# Patient Record
Sex: Female | Born: 1960 | Race: White | Hispanic: No | Marital: Married | Smoking: Never smoker
Health system: Southern US, Community
[De-identification: ages and names within clinical notes are randomized; demographics above are authoritative.]

## PROBLEM LIST (undated history)

## (undated) DIAGNOSIS — Z8619 Personal history of other infectious and parasitic diseases: Secondary | ICD-10-CM

## (undated) DIAGNOSIS — I080 Rheumatic disorders of both mitral and aortic valves: Secondary | ICD-10-CM

## (undated) DIAGNOSIS — K219 Gastro-esophageal reflux disease without esophagitis: Secondary | ICD-10-CM

## (undated) DIAGNOSIS — I272 Pulmonary hypertension, unspecified: Secondary | ICD-10-CM

## (undated) DIAGNOSIS — J45909 Unspecified asthma, uncomplicated: Secondary | ICD-10-CM

## (undated) DIAGNOSIS — I3139 Other pericardial effusion (noninflammatory): Secondary | ICD-10-CM

## (undated) DIAGNOSIS — Z77098 Contact with and (suspected) exposure to other hazardous, chiefly nonmedicinal, chemicals: Secondary | ICD-10-CM

## (undated) DIAGNOSIS — I519 Heart disease, unspecified: Secondary | ICD-10-CM

## (undated) DIAGNOSIS — B0229 Other postherpetic nervous system involvement: Secondary | ICD-10-CM

## (undated) DIAGNOSIS — E039 Hypothyroidism, unspecified: Secondary | ICD-10-CM

## (undated) DIAGNOSIS — I313 Pericardial effusion (noninflammatory): Secondary | ICD-10-CM

## (undated) DIAGNOSIS — F329 Major depressive disorder, single episode, unspecified: Secondary | ICD-10-CM

## (undated) DIAGNOSIS — I517 Cardiomegaly: Secondary | ICD-10-CM

## (undated) DIAGNOSIS — J984 Other disorders of lung: Secondary | ICD-10-CM

## (undated) DIAGNOSIS — G902 Horner's syndrome: Secondary | ICD-10-CM

## (undated) DIAGNOSIS — F32A Depression, unspecified: Secondary | ICD-10-CM

## (undated) DIAGNOSIS — E785 Hyperlipidemia, unspecified: Secondary | ICD-10-CM

## (undated) DIAGNOSIS — Z889 Allergy status to unspecified drugs, medicaments and biological substances status: Secondary | ICD-10-CM

## (undated) DIAGNOSIS — I071 Rheumatic tricuspid insufficiency: Secondary | ICD-10-CM

## (undated) DIAGNOSIS — I10 Essential (primary) hypertension: Secondary | ICD-10-CM

## (undated) HISTORY — PX: ACHILLES TENDON LENGTHENING: SUR826

## (undated) HISTORY — PX: CHOLECYSTECTOMY: SHX55

## (undated) HISTORY — PX: TONSILLECTOMY: SUR1361

## (undated) HISTORY — PX: ABDOMINAL HYSTERECTOMY: SHX81

## (undated) HISTORY — PX: CYSTOSCOPY: SUR368

## (undated) HISTORY — PX: ORIF WRIST FRACTURE: SHX2133

## (undated) HISTORY — PX: LAPAROSCOPIC GASTRIC BANDING: SHX1100

---

## 1995-03-18 DIAGNOSIS — Z77098 Contact with and (suspected) exposure to other hazardous, chiefly nonmedicinal, chemicals: Secondary | ICD-10-CM

## 1995-03-18 HISTORY — DX: Contact with and (suspected) exposure to other hazardous, chiefly nonmedicinal, chemicals: Z77.098

## 2016-09-23 ENCOUNTER — Ambulatory Visit
Admission: EM | Admit: 2016-09-23 | Discharge: 2016-09-23 | Disposition: A | Discharge status: Home / Self Care | Payer: BLUE CROSS/BLUE SHIELD | Attending: Family Medicine | Admitting: Family Medicine

## 2016-09-23 ENCOUNTER — Ambulatory Visit (INDEPENDENT_AMBULATORY_CARE_PROVIDER_SITE_OTHER): Payer: BLUE CROSS/BLUE SHIELD

## 2016-09-23 DIAGNOSIS — S93401A Sprain of unspecified ligament of right ankle, initial encounter: Secondary | ICD-10-CM

## 2016-09-23 DIAGNOSIS — W19XXXA Unspecified fall, initial encounter: Secondary | ICD-10-CM

## 2016-09-23 DIAGNOSIS — S92425A Nondisplaced fracture of distal phalanx of left great toe, initial encounter for closed fracture: Secondary | ICD-10-CM

## 2016-09-23 DIAGNOSIS — Y92009 Unspecified place in unspecified non-institutional (private) residence as the place of occurrence of the external cause: Secondary | ICD-10-CM | POA: Diagnosis not present

## 2016-09-23 HISTORY — DX: Other postherpetic nervous system involvement: B02.29

## 2016-09-23 HISTORY — DX: Gastro-esophageal reflux disease without esophagitis: K21.9

## 2016-09-23 HISTORY — DX: Cardiomegaly: I51.7

## 2016-09-23 HISTORY — DX: Rheumatic tricuspid insufficiency: I07.1

## 2016-09-23 HISTORY — DX: Allergy status to unspecified drugs, medicaments and biological substances: Z88.9

## 2016-09-23 HISTORY — DX: Major depressive disorder, single episode, unspecified: F32.9

## 2016-09-23 HISTORY — DX: Other pericardial effusion (noninflammatory): I31.39

## 2016-09-23 HISTORY — DX: Depression, unspecified: F32.A

## 2016-09-23 HISTORY — DX: Hyperlipidemia, unspecified: E78.5

## 2016-09-23 HISTORY — DX: Pulmonary hypertension, unspecified: I27.20

## 2016-09-23 HISTORY — DX: Hypothyroidism, unspecified: E03.9

## 2016-09-23 HISTORY — DX: Pericardial effusion (noninflammatory): I31.3

## 2016-09-23 HISTORY — DX: Contact with and (suspected) exposure to other hazardous, chiefly nonmedicinal, chemicals: Z77.098

## 2016-09-23 HISTORY — DX: Personal history of other infectious and parasitic diseases: Z86.19

## 2016-09-23 HISTORY — DX: Essential (primary) hypertension: I10

## 2016-09-23 HISTORY — DX: Other disorders of lung: J98.4

## 2016-09-23 HISTORY — DX: Heart disease, unspecified: I51.9

## 2016-09-23 HISTORY — DX: Rheumatic disorders of both mitral and aortic valves: I08.0

## 2016-09-23 HISTORY — DX: Horner's syndrome: G90.2

## 2016-09-23 HISTORY — DX: Unspecified asthma, uncomplicated: J45.909

## 2016-09-23 NOTE — ED Provider Notes (Signed)
CSN: [id number]     Arrival date & time 09/23/16  0805 History   First MD Initiated Contact with Patient 09/23/16 0840     Chief Complaint  Patient presents with  . Fall  . Ankle Pain    Right   (Consider location/radiation/quality/duration/timing/severity/associated sxs/prior Treatment) HPI  56 year old female presents with a right ankle and left great toe pain occurred this morning when she tripped and fell forward on her deck approximately an hour ago. She has pain over the right lateral ankle over the distal fibula with her swelling and ecchymosis present. Addition she is having a pain over in the left great toe at the MTP joint. She has abrasions on both her knees the left is worse than the right. She has already place Bactroban ointment on the abrasion. She denies any other injuries.        Past Medical History:  Diagnosis Date  . Asthma   . Chlorine gas exposure 1997   hospitalization and intubation  . Chronic lung disease   . Decreased left ventricular function   . Depression   . GERD (gastroesophageal reflux disease)   . History of sepsis   . Horner's syndrome   . Hyperlipidemia   . Hypertension   . Hypothyroidism   . Left ventricular hypertrophy   . Mitral regurgitation and aortic stenosis   . Multiple allergies   . Pericardial effusion   . Post herpetic neuralgia   . Pulmonary hypertension (HCC)   . Tricuspid regurgitation    Past Surgical History:  Procedure Laterality Date  . ABDOMINAL HYSTERECTOMY    . ACHILLES TENDON LENGTHENING    . CHOLECYSTECTOMY    . CYSTOSCOPY    . LAPAROSCOPIC GASTRIC BANDING    . ORIF WRIST FRACTURE    . TONSILLECTOMY     Family History  Problem Relation Age of Onset  . Alzheimer's disease Father   . Bladder Cancer Father   . Skin cancer Sister   . Diabetes Sister   . CVA Maternal Grandmother    Social History  Substance Use Topics  . Smoking status: Never Smoker  . Smokeless tobacco: Never Used  . Alcohol use No    OB History    No data available     Review of Systems  Constitutional: Positive for activity change. Negative for chills, fatigue and fever.  Musculoskeletal: Positive for arthralgias, gait problem and joint swelling.  All other systems reviewed and are negative.   Allergies  Adhesive [tape]; Aspartame and phenylalanine; Azithromycin; Benzonatate; Cephalexin; Ciprofloxacin; Eggs or egg-derived products; Erythromycin; Fexofenadine; Fluticasone; Ipratropium; Ketorolac; Metformin and related; Methylprednisolone; Morphine and related; Nsaids; Penicillins; Prednisone; Prochlorperazine; Sulfa antibiotics; and Tetracyclines & related  Home Medications   Prior to Admission medications   Medication Sig Start Date End Date Taking? Authorizing Provider  albuterol (PROVENTIL HFA;VENTOLIN HFA) 108 (90 Base) MCG/ACT inhaler Inhale into the lungs every 6 (six) hours as needed for wheezing or shortness of breath.   Yes [provider]  Alpha-Lipoic Acid 300 MG CAPS Take by mouth.   Yes [provider]  azelastine (ASTELIN) 0.1 % nasal spray Place into both nostrils 2 (two) times daily. Use in each nostril as directed   Yes [provider]  b complex vitamins tablet Take 1 tablet by mouth daily.   Yes [provider]  baclofen (LIORESAL) 20 MG tablet Take 20 mg by mouth 3 (three) times daily.   Yes [provider]  budesonide (PULMICORT) 180 MCG/ACT inhaler Inhale  into the lungs 2 (two) times daily.   Yes [provider]  buPROPion (WELLBUTRIN SR) 150 MG 12 hr tablet Take 150 mg by mouth 2 (two) times daily.   Yes [provider]  cholecalciferol (VITAMIN D) 1000 units tablet Take 1,000 Units by mouth daily.   Yes [provider]  diazepam (VALIUM) 2 MG tablet Take 2 mg by mouth every 6 (six) hours as needed for anxiety.   Yes [provider]  diphenhydrAMINE (BENADRYL) 25 MG tablet Take 25 mg by mouth every 6 (six) hours  as needed.   Yes [provider]  DULoxetine (CYMBALTA) 60 MG capsule Take 60 mg by mouth daily.   Yes [provider]  EPINEPHrine 0.3 mg/0.3 mL IJ SOAJ injection Inject into the muscle once.   Yes [provider]  fluticasone-salmeterol (ADVAIR HFA) 230-21 MCG/ACT inhaler Inhale 2 puffs into the lungs 2 (two) times daily.   Yes [provider]  gabapentin (NEURONTIN) 300 MG capsule Take 300 mg by mouth 3 (three) times daily.   Yes [provider]  hydrOXYzine (ATARAX/VISTARIL) 25 MG tablet Take 25 mg by mouth every 8 (eight) hours as needed.   Yes [provider]  levocetirizine (XYZAL) 5 MG tablet Take 5 mg by mouth every evening.   Yes [provider]  levothyroxine (SYNTHROID, LEVOTHROID) 50 MCG tablet Take 50 mcg by mouth daily before breakfast.   Yes [provider]  lidocaine (LIDODERM) 5 % Place 1 patch onto the skin daily. Remove & Discard patch within 12 hours or as directed by MD   Yes [provider]  Multiple Vitamin (MULTIVITAMIN) tablet Take 1 tablet by mouth daily.   Yes [provider]  mupirocin ointment (BACTROBAN) 2 % Place 1 application into the nose 2 (two) times daily.   Yes [provider]  omalizumab Geoffry Paradise) 150 MG injection Inject 150 mg into the skin every 28 (twenty-eight) days.   Yes [provider]  omeprazole (PRILOSEC) 20 MG capsule Take 20 mg by mouth daily.   Yes [provider]  oxycodone (OXY-IR) 5 MG capsule Take 5 mg by mouth every 6 (six) hours as needed.   Yes [provider]  pregabalin (LYRICA) 50 MG capsule Take 50 mg by mouth 3 (three) times daily.   Yes [provider]  promethazine (PHENERGAN) 12.5 MG tablet Take 12.5 mg by mouth every 6 (six) hours as needed for nausea or vomiting.   Yes [provider]  traMADol (ULTRAM) 50 MG tablet Take by mouth every 6 (six) hours as needed.   Yes [provider]   Triamcinolone Acetonide (NASACORT AQ NA) Place into the nose.   Yes [provider]  valsartan (DIOVAN) 80 MG tablet Take 80 mg by mouth daily.   Yes [provider]   Meds Ordered and Administered this Visit  Medications - No data to display  BP (!) 184/107 (BP Location: Left Arm)   Pulse 78   Temp 98.2 F (36.8 C) (Oral)   Resp 16   Ht 5\' 9"  (1.753 m)   Wt 250 lb (113.4 kg)   SpO2 97%   BMI 36.92 kg/m  No data found.   Physical Exam  Constitutional: She is oriented to person, place, and time. She appears well-developed and well-nourished. No distress.  HENT:  Head: Normocephalic and atraumatic.  Eyes: Pupils are equal, round, and reactive to light.  Neck: Normal range of motion.  Musculoskeletal: She exhibits edema  and tenderness.  Examination of the right ankle shows swelling over the distal fibula. There is tenderness sharply localized over the distal fibular tip and just inferior to it. He does not have any tenderness over the anterior fibulotalar ligament. She has decreased range of motion of to to pain. She has no tenderness over the medial malleolus or along any of the tarsals or metatarsals bones.  Examination of the left great toe shows no ecchymosis or erythema present. Patient does have tenderness and pain with any motion of the great toe at the IP joint. Other toes metatarsals and tarsals are nontender. She has no tenderness about the ankle. Patient does have abrasions over the knees more prevalent on the left than the right.  Neurological: She is alert and oriented to person, place, and time.  Skin: Skin is warm and dry. She is not diaphoretic. There is erythema.  Psychiatric: She has a normal mood and affect. Her behavior is normal. Judgment and thought content normal.  Nursing note and vitals reviewed.   Urgent Care Course     Procedures (including critical care time)  Labs Review Labs Reviewed - No data to display  Imaging Review Dg  Ankle Complete Right  Result Date: 09/23/2016 CLINICAL DATA:  Fall, right lateral ankle pain EXAM: RIGHT ANKLE - COMPLETE 3+ VIEW COMPARISON:  None. FINDINGS: Spurring at the lateral malleolus. No fracture, subluxation or dislocation. Soft tissues are intact. IMPRESSION: No acute bony abnormality. Electronically Signed   By: Charlett Nose M.D.   On: 09/23/2016 09:11   Dg Toe Great Left  Result Date: 09/23/2016 CLINICAL DATA:  Injury EXAM: LEFT GREAT TOE COMPARISON:  None. FINDINGS: There is lucency through the lateral base of the distal phalanx of the great toe extending to the articular surface. Nondisplaced fracture is not excluded. IMPRESSION: Nondisplaced fracture involving the base of the distal phalanx of the great toe. Electronically Signed   By: Jolaine Click M.D.   On: 09/23/2016 09:13     Visual Acuity Review  Right Eye Distance:   Left Eye Distance:   Bilateral Distance:    Right Eye Near:   Left Eye Near:    Bilateral Near:         MDM   1. Nondisplaced fracture of distal phalanx of left great toe, initial encounter for closed fracture   2. Sprain of right ankle, unspecified ligament, initial encounter   3. Fall in home, initial encounter    Discharge Medication List as of 09/23/2016  9:31 AM    Plan: 1. Test/x-ray results and diagnosis reviewed with patient 2. rx as per orders; risks, benefits, potential side effects reviewed with patient 3. Recommend supportive treatment with Buddy taping of the toe fracture and given a post op shoe for support. For her left ankle sprain she was given a boot orthosis to enable of quicker return to  ambulation. She continues to have problems I have recommended she be referred to her primary care physician or podiatrist. 4. F/u prn if symptoms worsen or don't improve     Lutricia Feil, PA-C 09/23/16 1214

## 2016-09-23 NOTE — ED Triage Notes (Signed)
Patient complains of a fall on her back deck this morning approx 1 hour ago. Patient states that she is now experiencing right ankle pain.

## 2019-01-13 IMAGING — CR DG TOE GREAT 2+V*L*
3 series · 3 of 3 positions shown · non-contrast
Comparison: None.

CLINICAL DATA: Injury

EXAM:
LEFT GREAT TOE

[toe ap]
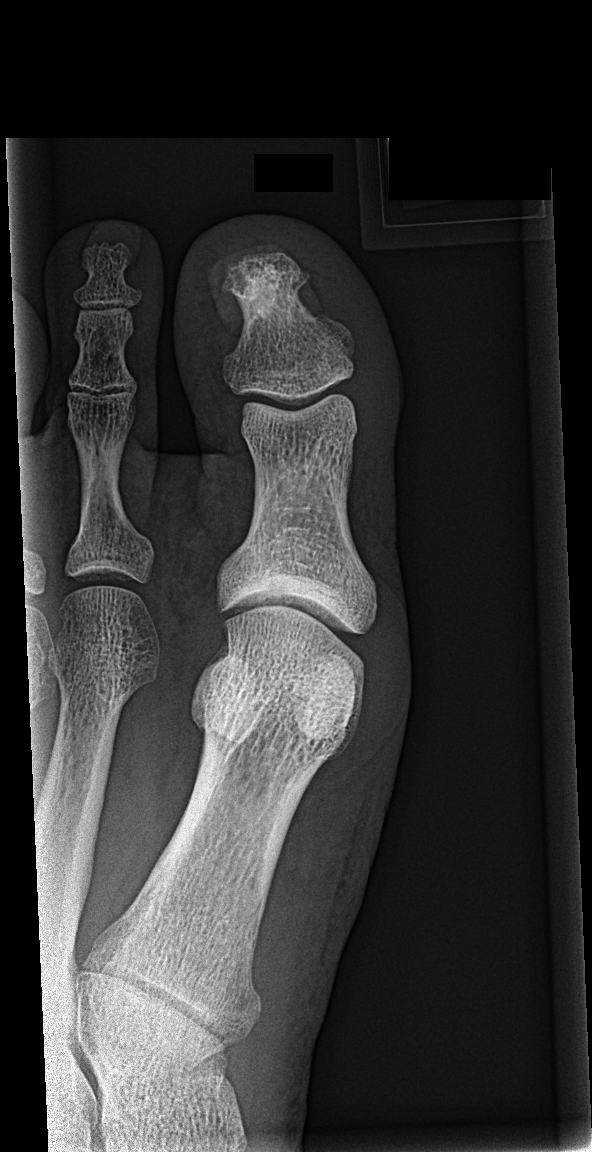

[toe obl]
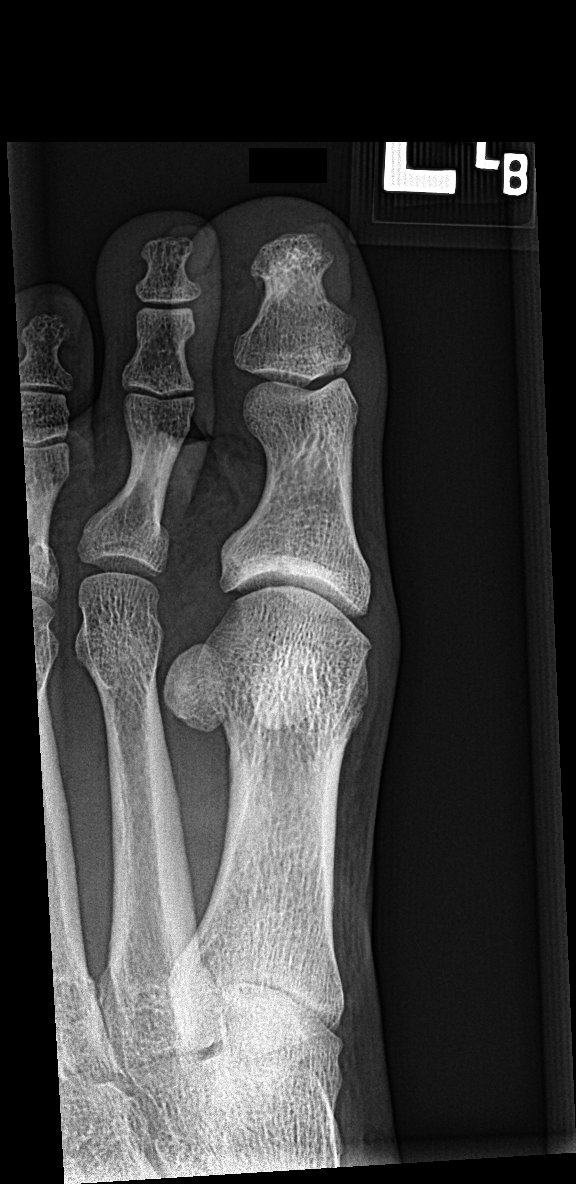

[toe lat]
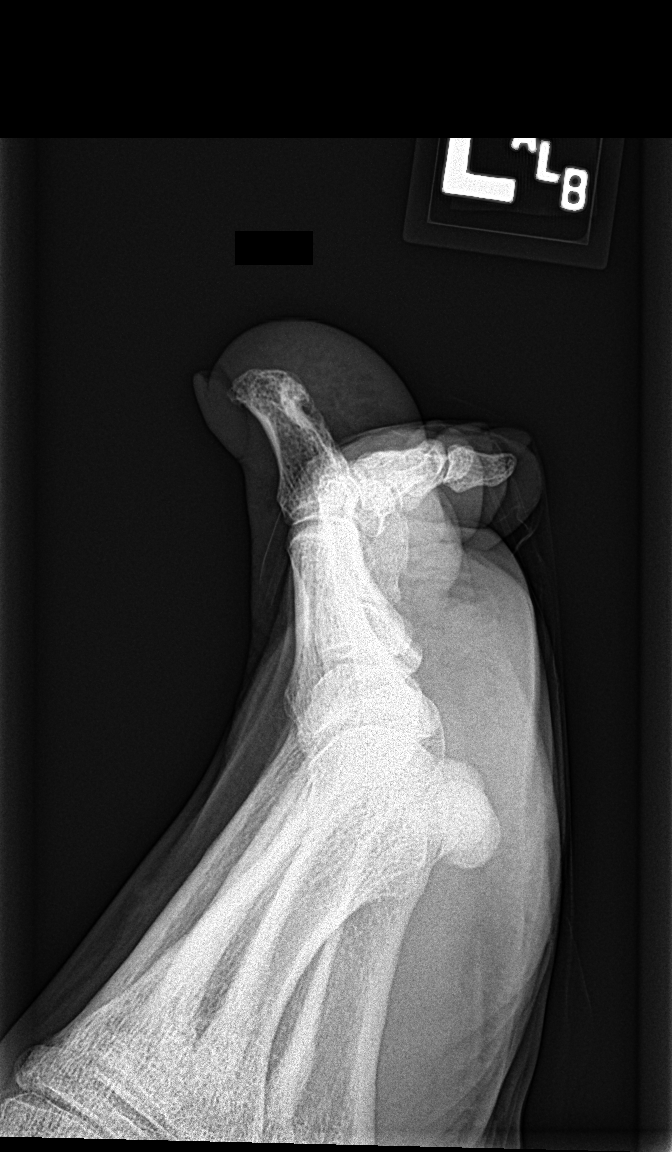

[3 of 3 positions shown; findings below may reference images not displayed]

FINDINGS: There is lucency through the lateral base of the distal phalanx of
the great toe extending to the articular surface. Nondisplaced
fracture is not excluded.
IMPRESSION: Nondisplaced fracture involving the base of the distal phalanx of
the great toe.
